# Patient Record
Sex: Male | Born: 1995 | Race: White | Hispanic: No | Marital: Single
Health system: Southern US, Community
[De-identification: ages and names within clinical notes are randomized; demographics above are authoritative.]

## PROBLEM LIST (undated history)

## (undated) ENCOUNTER — Emergency Department: Admission: EM | Discharge status: Absent without leave | Payer: BLUE CROSS/BLUE SHIELD

## (undated) DIAGNOSIS — F909 Attention-deficit hyperactivity disorder, unspecified type: Secondary | ICD-10-CM

---
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
## 2017-09-22 DIAGNOSIS — F900 Attention-deficit hyperactivity disorder, predominantly inattentive type: Secondary | ICD-10-CM | POA: Diagnosis not present

## 2018-02-01 DIAGNOSIS — F909 Attention-deficit hyperactivity disorder, unspecified type: Secondary | ICD-10-CM | POA: Diagnosis not present

## 2018-02-01 DIAGNOSIS — Z6824 Body mass index (BMI) 24.0-24.9, adult: Secondary | ICD-10-CM | POA: Diagnosis not present

## 2018-04-15 DIAGNOSIS — Z23 Encounter for immunization: Secondary | ICD-10-CM | POA: Diagnosis not present

## 2018-09-13 DIAGNOSIS — J060 Acute laryngopharyngitis: Secondary | ICD-10-CM | POA: Diagnosis not present

## 2019-06-25 DIAGNOSIS — Z0001 Encounter for general adult medical examination with abnormal findings: Secondary | ICD-10-CM | POA: Diagnosis not present

## 2019-06-25 DIAGNOSIS — E663 Overweight: Secondary | ICD-10-CM | POA: Diagnosis not present

## 2019-06-25 DIAGNOSIS — Z6829 Body mass index (BMI) 29.0-29.9, adult: Secondary | ICD-10-CM | POA: Diagnosis not present

## 2019-06-25 DIAGNOSIS — Z23 Encounter for immunization: Secondary | ICD-10-CM | POA: Diagnosis not present

## 2020-12-16 DIAGNOSIS — R030 Elevated blood-pressure reading, without diagnosis of hypertension: Secondary | ICD-10-CM | POA: Diagnosis not present

## 2020-12-16 DIAGNOSIS — R519 Headache, unspecified: Secondary | ICD-10-CM | POA: Diagnosis not present
